# Patient Record
Sex: Male | Born: 1960 | Race: Black or African American | Hispanic: No | Marital: Married
Health system: Southern US, Community
[De-identification: ages and names within clinical notes are randomized; demographics above are authoritative.]

---

## 2006-01-27 ENCOUNTER — Ambulatory Visit: Payer: Self-pay | Admitting: Urology

## 2007-04-25 ENCOUNTER — Ambulatory Visit: Payer: Self-pay | Admitting: Internal Medicine

## 2007-04-27 ENCOUNTER — Ambulatory Visit: Payer: Self-pay | Admitting: Internal Medicine

## 2010-01-04 ENCOUNTER — Inpatient Hospital Stay: Payer: Self-pay | Admitting: Internal Medicine

## 2010-06-29 ENCOUNTER — Ambulatory Visit: Payer: Self-pay | Admitting: Internal Medicine
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
## 2020-08-05 ENCOUNTER — Other Ambulatory Visit: Payer: Self-pay

## 2020-08-05 ENCOUNTER — Ambulatory Visit: Payer: BC Managed Care – PPO | Attending: Physician Assistant | Admitting: Occupational Therapy

## 2020-08-05 DIAGNOSIS — M25642 Stiffness of left hand, not elsewhere classified: Secondary | ICD-10-CM | POA: Diagnosis present

## 2020-08-05 DIAGNOSIS — M79642 Pain in left hand: Secondary | ICD-10-CM | POA: Diagnosis not present

## 2020-08-05 DIAGNOSIS — M6281 Muscle weakness (generalized): Secondary | ICD-10-CM
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
## 2020-08-13 ENCOUNTER — Ambulatory Visit: Payer: BC Managed Care – PPO | Admitting: Occupational Therapy

## 2020-08-13 ENCOUNTER — Other Ambulatory Visit: Payer: Self-pay
